# Patient Record
Sex: Male | Born: 1970 | Race: White | Hispanic: No | Marital: Married | Smoking: Current every day smoker
Health system: Southern US, Community
[De-identification: ages and names within clinical notes are randomized; demographics above are authoritative.]

## PROBLEM LIST (undated history)

## (undated) DIAGNOSIS — E119 Type 2 diabetes mellitus without complications: Secondary | ICD-10-CM

## (undated) DIAGNOSIS — I10 Essential (primary) hypertension: Secondary | ICD-10-CM

## (undated) HISTORY — PX: APPENDECTOMY: SHX54

---

## 2007-09-24 ENCOUNTER — Emergency Department (HOSPITAL_COMMUNITY): Admission: EM | Admit: 2007-09-24 | Discharge: 2007-09-24 | Payer: Self-pay | Admitting: Emergency Medicine

## 2010-07-07 DIAGNOSIS — R079 Chest pain, unspecified: Secondary | ICD-10-CM

## 2010-12-02 LAB — BASIC METABOLIC PANEL
BUN: 17
CO2: 22
Calcium: 9.4
GFR calc non Af Amer: >60
Glucose, Bld: 214 — ABNORMAL HIGH
Sodium: 136

## 2013-10-16 ENCOUNTER — Emergency Department (HOSPITAL_COMMUNITY): Payer: Worker's Compensation

## 2013-10-16 ENCOUNTER — Emergency Department (HOSPITAL_COMMUNITY)
Admission: EM | Admit: 2013-10-16 | Discharge: 2013-10-16 | Disposition: A | Discharge status: Home / Self Care | Payer: Worker's Compensation | Attending: Emergency Medicine | Admitting: Emergency Medicine

## 2013-10-16 ENCOUNTER — Encounter (HOSPITAL_COMMUNITY): Payer: Self-pay | Admitting: Emergency Medicine

## 2013-10-16 DIAGNOSIS — E119 Type 2 diabetes mellitus without complications: Secondary | ICD-10-CM | POA: Diagnosis not present

## 2013-10-16 DIAGNOSIS — Z79899 Other long term (current) drug therapy: Secondary | ICD-10-CM | POA: Insufficient documentation

## 2013-10-16 DIAGNOSIS — I10 Essential (primary) hypertension: Secondary | ICD-10-CM | POA: Diagnosis not present

## 2013-10-16 DIAGNOSIS — Z792 Long term (current) use of antibiotics: Secondary | ICD-10-CM | POA: Diagnosis not present

## 2013-10-16 DIAGNOSIS — L03119 Cellulitis of unspecified part of limb: Secondary | ICD-10-CM

## 2013-10-16 DIAGNOSIS — F17200 Nicotine dependence, unspecified, uncomplicated: Secondary | ICD-10-CM | POA: Insufficient documentation

## 2013-10-16 DIAGNOSIS — IMO0002 Reserved for concepts with insufficient information to code with codable children: Secondary | ICD-10-CM | POA: Diagnosis present

## 2013-10-16 HISTORY — DX: Essential (primary) hypertension: I10

## 2013-10-16 HISTORY — DX: Type 2 diabetes mellitus without complications: E11.9

## 2013-10-16 LAB — COMPREHENSIVE METABOLIC PANEL
ALK PHOS: 91 U/L (ref 39–117)
ALT: 23 U/L (ref 0–53)
AST: 18 U/L (ref 0–37)
Albumin: 4.3 g/dL (ref 3.5–5.2)
Anion gap: 16 — ABNORMAL HIGH (ref 5–15)
BILIRUBIN TOTAL: 0.6 mg/dL (ref 0.3–1.2)
BUN: 17 mg/dL (ref 6–23)
CALCIUM: 9.3 mg/dL (ref 8.4–10.5)
CHLORIDE: 98 meq/L (ref 96–112)
CO2: 23 meq/L (ref 19–32)
Creatinine, Ser: 0.67 mg/dL (ref 0.50–1.35)
GLUCOSE: 243 mg/dL — AB (ref 70–99)
Potassium: 3.6 mEq/L — ABNORMAL LOW (ref 3.7–5.3)
SODIUM: 137 meq/L (ref 137–147)
Total Protein: 7.2 g/dL (ref 6.0–8.3)

## 2013-10-16 LAB — CBC WITH DIFFERENTIAL/PLATELET
Basophils Absolute: 0 10*3/uL (ref 0.0–0.1)
Basophils Relative: 0 % (ref 0–1)
EOS PCT: 3 % (ref 0–5)
Eosinophils Absolute: 0.2 10*3/uL (ref 0.0–0.7)
HEMATOCRIT: 41.9 % (ref 39.0–52.0)
Hemoglobin: 15.1 g/dL (ref 13.0–17.0)
LYMPHS ABS: 1.9 10*3/uL (ref 0.7–4.0)
LYMPHS PCT: 27 % (ref 12–46)
MCH: 30.2 pg (ref 26.0–34.0)
MCHC: 36 g/dL (ref 30.0–36.0)
MCV: 83.8 fL (ref 78.0–100.0)
Monocytes Absolute: 0.3 10*3/uL (ref 0.1–1.0)
Monocytes Relative: 5 % (ref 3–12)
Neutro Abs: 4.7 10*3/uL (ref 1.7–7.7)
Neutrophils Relative %: 65 % (ref 43–77)
PLATELETS: 153 10*3/uL (ref 150–400)
RBC: 5 MIL/uL (ref 4.22–5.81)
RDW: 12.7 % (ref 11.5–15.5)
WBC: 7.1 10*3/uL (ref 4.0–10.5)

## 2013-10-16 MED ORDER — SULFAMETHOXAZOLE-TRIMETHOPRIM 800-160 MG PO TABS: 1.0000 | ORAL_TABLET | Freq: Two times a day (BID) | ORAL | Status: AC

## 2013-10-16 MED ORDER — VANCOMYCIN HCL IN DEXTROSE 1-5 GM/200ML-% IV SOLN
1000.0000 mg | Freq: Once | INTRAVENOUS | Status: AC
  Administered 2013-10-16: 1000 mg via INTRAVENOUS
  Filled 2013-10-16: qty 200

## 2013-10-16 NOTE — ED Notes (Signed)
Pt reports hit left arm with a "tow motor" x2 weeks ago. Pt sent here from PCP. PCP tried to lance site prior to arrival with no success. Pt reports left forearm swelling and redness spreading up left arm. Pt denies any known fevers. Cap refill <3 secs. LUE warm to touch. Radial pulses strong intact. nad noted.

## 2013-10-16 NOTE — ED Notes (Signed)
Pt reports doctor directed him to ED because of a wound infection. Pt hit L arm on tow motor two weeks ago and doctor lanced wound about one hour ago and it didnt "do any good." Pt started ceflexin yesterday.

## 2013-10-16 NOTE — Discharge Instructions (Signed)
Start bactrim and continue the keflex.  Follow up here tomorrow to see dr. Blinda LeatherwoodPollina around 1pm

## 2013-10-16 NOTE — ED Provider Notes (Signed)
CSN: [id number]     Arrival date & time 10/16/13  1454 History  This chart was scribed for Benny Lennert, MD by Bronson Curb, ED Scribe. This patient was seen in room APA05/[facility]05 and the patient's care was started at 3:20 PM.    Chief Complaint  Patient presents with  . Wound Infection      Patient is a 43 y.o. male presenting with wound check. The history is provided by the patient. No language interpreter was used.  Wound Check The current episode started more than 1 week ago. The problem has been gradually worsening. Pertinent negatives include no chest pain, no abdominal pain, no headaches and no shortness of breath. Nothing aggravates the symptoms. Nothing relieves the symptoms.    HPI Comments: Adrian Moss is a 43 y.o. male who presents to the Emergency Department for wound check on left forearm. P states he he hit his let arm on a tow motor approximately 2 weeks ago and was sent here from his PCP who attempted to lance the site without success. There is associated swelling, tenderness, and redness that is spreading up his left arm. Patient denies fever Patient has history of HTN and DM.   Past Medical History  Diagnosis Date  . Hypertension   . Diabetes mellitus without complication    Past Surgical History  Procedure Laterality Date  . Appendectomy     History reviewed. No pertinent family history. History  Substance Use Topics  . Smoking status: Current Every Day Smoker -- 0.25 packs/day  . Smokeless tobacco: Not on file  . Alcohol Use: Yes     Comment: occasional    Review of Systems  Constitutional: Negative for appetite change and fatigue.  HENT: Negative for congestion, ear discharge and sinus pressure.   Eyes: Negative for discharge.  Respiratory: Negative for cough and shortness of breath.   Cardiovascular: Negative for chest pain.  Gastrointestinal: Negative for abdominal pain and diarrhea.  Genitourinary: Negative for frequency and hematuria.   Musculoskeletal: Negative for back pain.  Skin: Positive for wound. Negative for rash.  Neurological: Negative for seizures and headaches.  Psychiatric/Behavioral: Negative for hallucinations.      Allergies  Niacin and related  Home Medications   Prior to Admission medications   Medication Sig Start Date End Date Taking? Authorizing Provider  cephALEXin (KEFLEX) 500 MG capsule Take 500 mg by mouth 2 (two) times daily. 10 day course starting on 10/15/2013 10/07/13  Yes Historical Provider, MD  fenofibrate 160 MG tablet Take 1 tablet by mouth every morning.  07/15/13  Yes Historical Provider, MD  lisinopril-hydrochlorothiazide (PRINZIDE,ZESTORETIC) 20-12.5 MG per tablet Take 1 tablet by mouth every morning. 07/15/13  Yes Historical Provider, MD  metFORMIN (GLUCOPHAGE) 1000 MG tablet Take 1,000 mg by mouth every morning.  07/11/13  Yes Historical Provider, MD  mupirocin ointment (BACTROBAN) 2 % Apply 1 application topically 2 (two) times daily. 10/07/13  Yes Historical Provider, MD  NEXIUM 40 MG capsule Take 40 mg by mouth every morning. 07/11/13  Yes Historical Provider, MD   Triage Vitals: BP 159/89  Pulse 98  Temp(Src) 98.8 F (37.1 C) (Oral)  Resp 18  Ht 6\' 3"  (1.905 m)  Wt 229 lb (103.874 kg)  BMI 28.62 kg/m2  SpO2 98%  Physical Exam  Constitutional: He is oriented to person, place, and time. He appears well-developed.  HENT:  Head: Normocephalic.  Eyes: Conjunctivae are normal.  Neck: No tracheal deviation present.  Cardiovascular:  No murmur heard.  Musculoskeletal: Normal range of motion.  Neurological: He is oriented to person, place, and time.  Skin: Skin is warm.  Posterior forearm has an indurated area about 4 cm in diameter that is tender, swollen, and inflamed. NVI  Psychiatric: He has a normal mood and affect.    ED Course  Procedures (including critical care time)  DIAGNOSTIC STUDIES: Oxygen Saturation is 98% on room air, normal by my interpretation.     COORDINATION OF CARE: At [id number] Discussed treatment plan with patient which includes imaging and labs. Also, discussed with patient possible admission to hospital. Patient agrees.   Labs Review Labs Reviewed - No data to display  Imaging Review No results found.   EKG Interpretation None      MDM   Final diagnoses:  None   Cellulitis left forearm.   Pt given vanc and bactrim and will return tomorrow for recheck  The chart was scribed for me under my direct supervision.  I personally performed the history, physical, and medical decision making and all procedures in the evaluation of this patient.Benny Lennert.     Deletha Jaffee L Eleazar Kimmey, MD 10/16/13 [phone]

## 2013-10-16 NOTE — ED Notes (Signed)
Outlined cellulitis on L arm

## 2013-10-19 ENCOUNTER — Telehealth (HOSPITAL_BASED_OUTPATIENT_CLINIC_OR_DEPARTMENT_OTHER): Payer: Self-pay | Admitting: Emergency Medicine

## 2013-10-19 LAB — WOUND CULTURE

## 2013-10-19 NOTE — Telephone Encounter (Signed)
Lab called positive wound culture, + MRSA Treated with Septra DS, sensitive to same per protocol MD Will contact patient with positive result.  10/19/13 @ 1516 left message to call flow managers #

## 2013-10-25 ENCOUNTER — Telehealth (HOSPITAL_BASED_OUTPATIENT_CLINIC_OR_DEPARTMENT_OTHER): Payer: Self-pay | Admitting: Emergency Medicine

## 2013-10-27 ENCOUNTER — Telehealth (HOSPITAL_BASED_OUTPATIENT_CLINIC_OR_DEPARTMENT_OTHER): Payer: Self-pay | Admitting: Emergency Medicine

## 2013-11-03 ENCOUNTER — Telehealth (HOSPITAL_BASED_OUTPATIENT_CLINIC_OR_DEPARTMENT_OTHER): Payer: Self-pay | Admitting: Emergency Medicine

## 2013-11-06 ENCOUNTER — Telehealth (HOSPITAL_BASED_OUTPATIENT_CLINIC_OR_DEPARTMENT_OTHER): Payer: Self-pay | Admitting: Emergency Medicine

## 2013-11-27 ENCOUNTER — Telehealth (HOSPITAL_COMMUNITY): Payer: Self-pay

## 2013-11-27 NOTE — ED Notes (Signed)
Unable to contact pt by mail or telephone. Unable to communicate lab results or treatment changes. 

## 2015-11-13 IMAGING — CR DG FOREARM 2V*L*
1 series · 1 of 1 positions shown · non-contrast
Comparison: None.

CLINICAL DATA: Wound and swelling

EXAM:
LEFT FOREARM - 2 VIEW

[view not recorded]
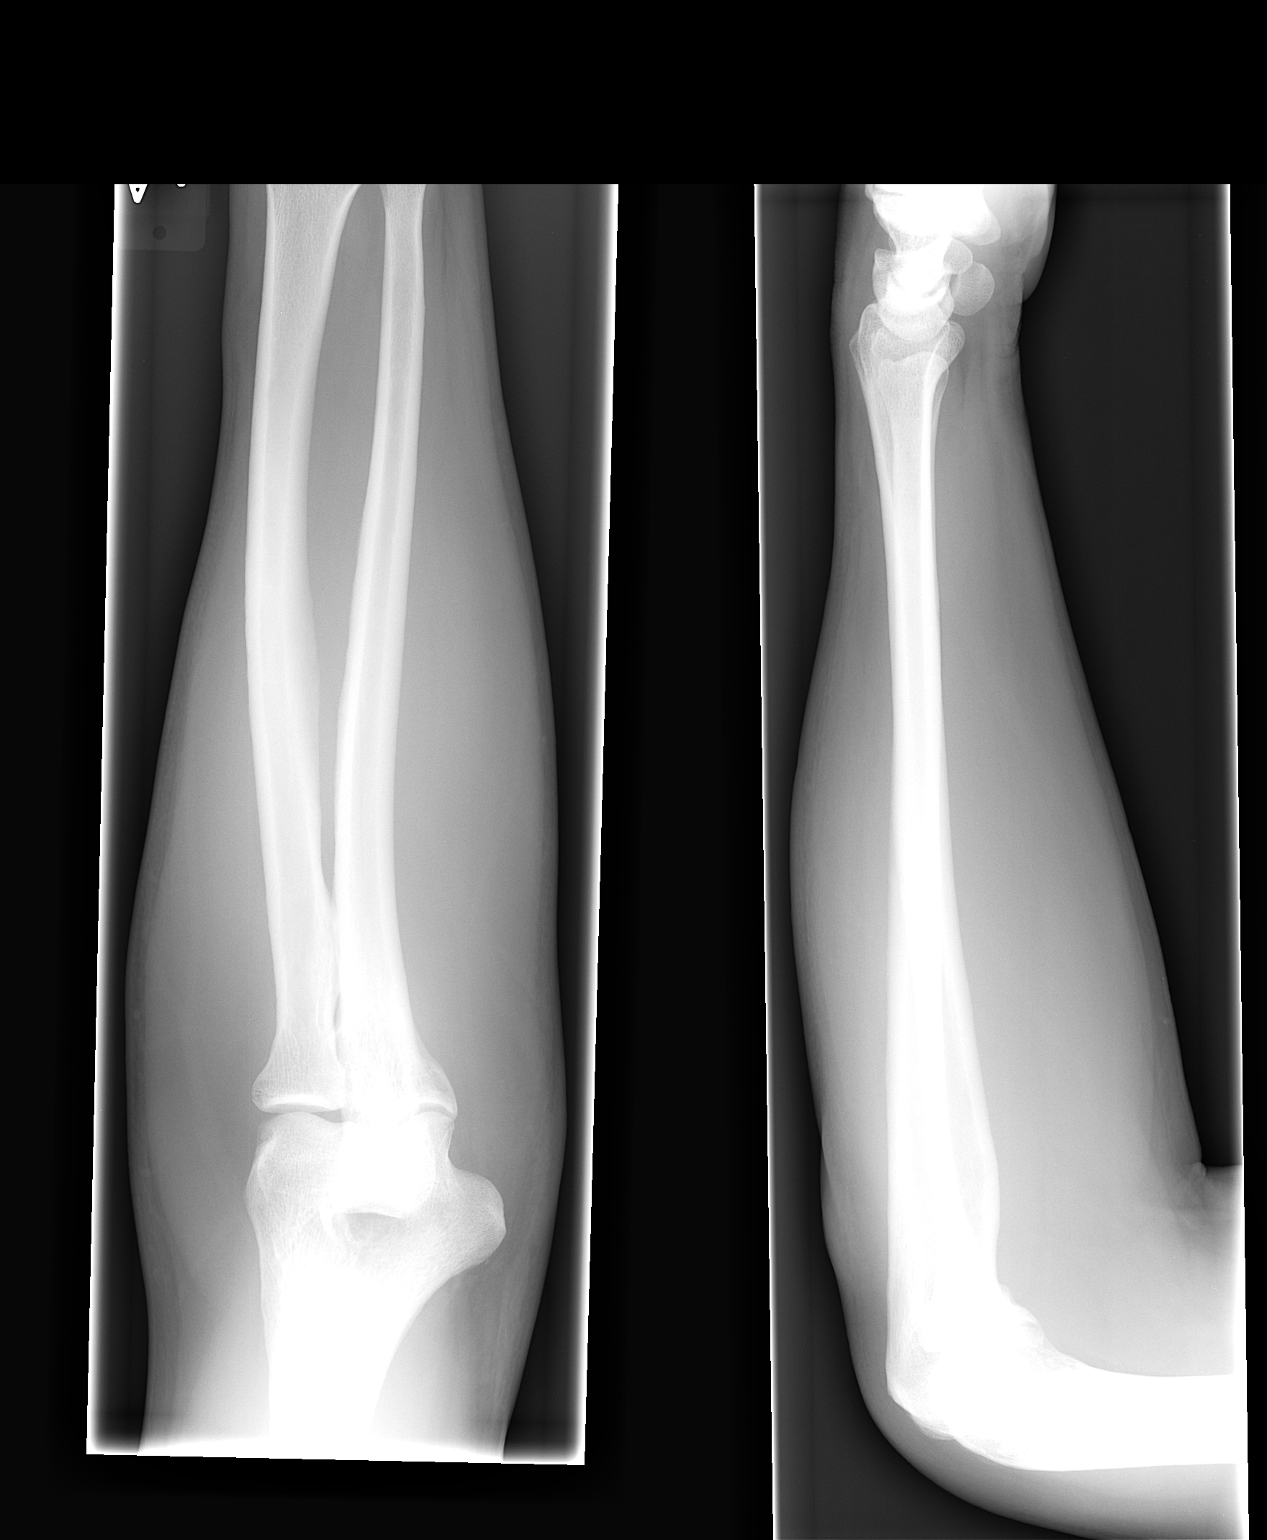

[1 of 1 positions shown; findings below may reference images not displayed]

FINDINGS: There is no evidence of fracture or other focal bone lesions. Soft
tissues are unremarkable.
IMPRESSION: No acute abnormality noted.

## 2016-09-12 ENCOUNTER — Emergency Department (HOSPITAL_COMMUNITY)
Admission: EM | Admit: 2016-09-12 | Discharge: 2016-09-12 | Disposition: A | Discharge status: Home / Self Care | Payer: BC Managed Care – PPO

## 2016-09-12 NOTE — ED Notes (Signed)
Pt called not in waiting room

## 2016-09-12 NOTE — ED Notes (Signed)
No answer when name called.

## 2016-09-12 NOTE — ED Notes (Signed)
Called pts name, not in waiting room

## 2016-09-12 NOTE — ED Notes (Signed)
Pt called no answer 

## 2021-04-04 ENCOUNTER — Ambulatory Visit: Payer: BC Managed Care – PPO | Admitting: Cardiovascular Disease
